# Patient Record
Sex: Female | Born: 1990 | Race: White | Hispanic: No | Marital: Single
Health system: Southern US, Community
[De-identification: ages and names within clinical notes are randomized; demographics above are authoritative.]

---
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
## 2014-05-31 ENCOUNTER — Other Ambulatory Visit: Payer: Self-pay | Admitting: Family Medicine

## 2014-05-31 ENCOUNTER — Other Ambulatory Visit (HOSPITAL_COMMUNITY)
Admission: RE | Admit: 2014-05-31 | Discharge: 2014-05-31 | Disposition: A | Discharge status: Home / Self Care | Payer: BC Managed Care – PPO | Source: Ambulatory Visit | Attending: Family Medicine | Admitting: Family Medicine

## 2014-05-31 DIAGNOSIS — Z124 Encounter for screening for malignant neoplasm of cervix: Secondary | ICD-10-CM | POA: Diagnosis present

## 2014-06-04 LAB — CYTOLOGY - PAP

## 2015-07-09 ENCOUNTER — Other Ambulatory Visit: Payer: Self-pay | Admitting: Family Medicine

## 2015-07-09 ENCOUNTER — Other Ambulatory Visit (HOSPITAL_COMMUNITY)
Admission: RE | Admit: 2015-07-09 | Discharge: 2015-07-09 | Disposition: A | Discharge status: Home / Self Care | Payer: 59 | Source: Ambulatory Visit | Attending: Family Medicine | Admitting: Family Medicine

## 2015-07-09 DIAGNOSIS — Z01411 Encounter for gynecological examination (general) (routine) with abnormal findings: Secondary | ICD-10-CM | POA: Diagnosis present

## 2015-07-14 LAB — CYTOLOGY - PAP

## 2022-06-16 DIAGNOSIS — Z6841 Body Mass Index (BMI) 40.0 and over, adult: Secondary | ICD-10-CM | POA: Diagnosis not present

## 2022-06-16 DIAGNOSIS — Z124 Encounter for screening for malignant neoplasm of cervix: Secondary | ICD-10-CM | POA: Diagnosis not present

## 2022-06-16 DIAGNOSIS — Z0000 Encounter for general adult medical examination without abnormal findings: Secondary | ICD-10-CM | POA: Diagnosis not present

## 2022-06-16 DIAGNOSIS — F4323 Adjustment disorder with mixed anxiety and depressed mood: Secondary | ICD-10-CM | POA: Diagnosis not present

## 2022-06-16 DIAGNOSIS — D509 Iron deficiency anemia, unspecified: Secondary | ICD-10-CM | POA: Diagnosis not present

## 2022-06-23 ENCOUNTER — Telehealth: Payer: Self-pay | Admitting: Physician Assistant

## 2022-06-23 NOTE — Telephone Encounter (Signed)
Scheduled appt per 9/6 referral. Pt is aware of appt date and time. Pt is aware to arrive 15 mins prior to appt time and to bring and updated insurance card. Pt is aware of appt location.   

## 2022-06-24 DIAGNOSIS — R69 Illness, unspecified: Secondary | ICD-10-CM | POA: Diagnosis not present

## 2022-07-07 DIAGNOSIS — R69 Illness, unspecified: Secondary | ICD-10-CM | POA: Diagnosis not present

## 2022-07-12 ENCOUNTER — Telehealth: Payer: Self-pay | Admitting: Physician Assistant

## 2022-07-12 NOTE — Telephone Encounter (Signed)
R/s pt's new hem appt. Pt is aware of new appt date/time.  

## 2022-07-16 ENCOUNTER — Inpatient Hospital Stay: Payer: Self-pay | Admitting: Physician Assistant

## 2022-07-16 ENCOUNTER — Inpatient Hospital Stay: Payer: Self-pay

## 2022-07-16 DIAGNOSIS — R69 Illness, unspecified: Secondary | ICD-10-CM | POA: Diagnosis not present

## 2022-07-21 DIAGNOSIS — Z111 Encounter for screening for respiratory tuberculosis: Secondary | ICD-10-CM | POA: Diagnosis not present

## 2022-07-22 DIAGNOSIS — R69 Illness, unspecified: Secondary | ICD-10-CM | POA: Diagnosis not present

## 2022-07-23 DIAGNOSIS — Z0279 Encounter for issue of other medical certificate: Secondary | ICD-10-CM | POA: Diagnosis not present

## 2022-07-27 NOTE — Progress Notes (Unsigned)
[place] [facility] [phone]   Fax:([phone]  CONSULT NOTE  REFERRING PHYSICIAN: Dr. Daron Offer   REASON FOR CONSULTATION:  Anemia, elevated WBC, and elevated platelet count   HPI Joan Watts is a 31 y.o. female with a past medical history significant for***frequent migraines, obesity, and chronic diarrhea is referred to the clinic for mild anemia, elevated/normal ferritin, elevated white blood cell count, and elevated platelet count.  The patient recently had a follow-up appointment with her PCP for an annual wellness visit on 9/6/thousand 23.  The patient's PCP noted that the patient was struggling with obesity and her weight.  She also endorsed heartburn.  The patient also has chronic intermittent diarrhea but declined referral to gastroenterology.  The patient has a hemorrhoid that waxes and wanes.  The patient previously had celiac testing which was negative.  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  The patient had repeat blood work performed that day which showed persistent microcytic anemia and an MCV low at 73.7.  The patient's platelet count was elevated at 458 K, the patient's white blood cell count was slightly elevated at 13.9.  The patient's iron studies showed iron of 35, ferritin of 257, TIBC at 454, and low saturation at 9%.  The patient's B12 was on the low end of normal at 258.  The patient had persistent lab abnormalities and referral to oncology/hematology was recommended.        Blood thinners, bleeding/bruising/  hemorrhoids? GI?   The oldest records I have dated back to 2020. She had similar appearing labs with elevated WBC of 14.8, mild microcytic anemia of 11.6, MCV of 77.2, and slightly elevated platelet count of 404. I also have labs from 06/16/22 and 08/27/20 which appear similar. To the patient's knowledge>>>   [place] bleeding? Ever need iron? Multivitamin.   Never need blood or iron.   How  taking B12? Compliant?    The patient denies any other known vitamin deficiencies besides B12. She is not presently on an iron supplement. Denies any history of bariatric surgery.  She is not a vegan or vegetarian.  She does not crave ice chips. No hx CKD.    Regardig symptoms of anemia, she reports*** fatigue, decreased exercise tolerance/dyspnea on exertion.  Denies any dizzy spells.  Denies any chest pain.     To the patient's knowledge, she is not sure how long she has had leukocytosis. She denies frequent or recent infection. She reports her allergies are controlled. About 10-15 years ago, she used to have severe allergies that often required steroid injections as well as p.o. steroids. She denies recent or frequent steroid use except in June 2023 for tendinitis in her ankle.    The patient is feeling fairly well today without any concerning complaints.  She denies any recent fevers, chills, unexplained weight loss, or night sweats.  Denies any recent nausea, vomiting, diarrhea, constipation, skin infections, dysuria, or rashes. Denies any recent sick contacts. For contraception, she does have Mirena IUD. She denies any inflammatory health conditions.  She denies any abdominal pain, early satiety, or abdominal bloating.  She denies any known lymphadenopathy. F?C/Ns/ LN/ Wt   HPI  No past medical history on file.  *** The histories are not reviewed yet. Please review them in the "History" navigator section and refresh this [place].  No family history on file.  Social History    Not on File  No current outpatient medications on file.   No  current facility-administered medications for this visit.    REVIEW OF SYSTEMS:   Review of Systems  Constitutional: Negative for appetite change, chills, fatigue, fever and unexpected weight change.  HENT:   Negative for mouth sores, nosebleeds, sore throat and trouble swallowing.   Eyes: Negative for eye problems and icterus.  Respiratory:  Negative for cough, hemoptysis, shortness of breath and wheezing.   Cardiovascular: Negative for chest pain and leg swelling.  Gastrointestinal: Negative for abdominal pain, constipation, diarrhea, nausea and vomiting.  Genitourinary: Negative for bladder incontinence, difficulty urinating, dysuria, frequency and hematuria.   Musculoskeletal: Negative for back pain, gait problem, neck pain and neck stiffness.  Skin: Negative for itching and rash.  Neurological: Negative for dizziness, extremity weakness, gait problem, headaches, light-headedness and seizures.  Hematological: Negative for adenopathy. Does not bruise/bleed easily.  Psychiatric/Behavioral: Negative for confusion, depression and sleep disturbance. The patient is not nervous/anxious.     PHYSICAL EXAMINATION:  There were no vitals taken for this visit.  ECOG PERFORMANCE STATUS: {CHL ONC ECOG [id number]  Physical Exam  Constitutional: Oriented to person, place, and time and well-developed, well-nourished, and in no distress. No distress.  HENT:  Head: Normocephalic and atraumatic.  Mouth/Throat: Oropharynx is clear and moist. No oropharyngeal exudate.  Eyes: Conjunctivae are normal. Right eye exhibits no discharge. Left eye exhibits no discharge. No scleral icterus.  Neck: Normal range of motion. Neck supple.  Cardiovascular: Normal rate, regular rhythm, normal heart sounds and intact distal pulses.   Pulmonary/Chest: Effort normal and breath sounds normal. No respiratory distress. No wheezes. No rales.  Abdominal: Soft. Bowel sounds are normal. Exhibits no distension and no mass. There is no tenderness.  Musculoskeletal: Normal range of motion. Exhibits no edema.  Lymphadenopathy:    No cervical adenopathy.  Neurological: Alert and oriented to person, place, and time. Exhibits normal muscle tone. Gait normal. Coordination normal.  Skin: Skin is warm and dry. No rash noted. Not diaphoretic. No erythema. No pallor.   Psychiatric: Mood, memory and judgment normal.  Vitals reviewed.  LABORATORY DATA: No results found for: "WBC", "HGB", "HCT", "MCV", "PLT"    Chemistry   No results found for: "NA", "K", "CL", "CO2", "BUN", "CREATININE", "GLU" No results found for: "CALCIUM", "ALKPHOS", "AST", "ALT", "BILITOT"     RADIOGRAPHIC STUDIES: No results found.  ASSESSMENT: This is a very pleasant 31 year old female referred to the clinic for anemia, elevated platelet, and elevated white blood cell count.  The patient was seen by Dr. Julien Nordmann today.  The patient several lab studies performed including a CBC, CMP, LDH, iron studies, ferritin, B12, folate, heavy metal testing, copper, hemoglobin electrophoresis, and JAK2 versus BCR ABL?  ESR and CRP? Stool cards???  The patient voices understanding of current disease status and treatment options and is in agreement with the current care plan.  All questions were answered. The patient knows to call the clinic with any problems, questions or concerns. We can certainly see the patient much sooner if necessary.  Thank you so much for allowing me to participate in the care of [facility]. I will continue to follow up the patient with you and assist in her care.  I spent {CHL ONC TIME VISIT - IBBCW:[id number]} counseling the patient face to face. The total time spent in the appointment was {CHL ONC TIME VISIT - [place]:[id number]}.  Disclaimer: This note was dictated with voice recognition software. Similar sounding words can inadvertently be transcribed and may not be corrected upon review.   [place]  July 27, 2022, 1:54 PM

## 2022-07-28 ENCOUNTER — Other Ambulatory Visit: Payer: Self-pay | Admitting: *Deleted

## 2022-07-28 ENCOUNTER — Inpatient Hospital Stay: Payer: 59

## 2022-07-28 ENCOUNTER — Other Ambulatory Visit: Payer: Self-pay | Admitting: Physician Assistant

## 2022-07-28 ENCOUNTER — Other Ambulatory Visit: Payer: Self-pay

## 2022-07-28 ENCOUNTER — Inpatient Hospital Stay: Payer: 59 | Attending: Physician Assistant | Admitting: Physician Assistant

## 2022-07-28 VITALS — BP 144/90 | HR 89 | Temp 99.0°F | Resp 16 | Wt 254.5 lb

## 2022-07-28 DIAGNOSIS — E538 Deficiency of other specified B group vitamins: Secondary | ICD-10-CM

## 2022-07-28 DIAGNOSIS — D75839 Thrombocytosis, unspecified: Secondary | ICD-10-CM | POA: Diagnosis not present

## 2022-07-28 DIAGNOSIS — D72829 Elevated white blood cell count, unspecified: Secondary | ICD-10-CM

## 2022-07-28 DIAGNOSIS — D509 Iron deficiency anemia, unspecified: Secondary | ICD-10-CM | POA: Insufficient documentation

## 2022-07-28 DIAGNOSIS — D649 Anemia, unspecified: Secondary | ICD-10-CM

## 2022-07-28 DIAGNOSIS — Z800 Family history of malignant neoplasm of digestive organs: Secondary | ICD-10-CM

## 2022-07-28 LAB — CMP (CANCER CENTER ONLY)
ALT: 33 U/L (ref 0–44)
AST: 20 U/L (ref 15–41)
Albumin: 3.7 g/dL (ref 3.5–5.0)
Alkaline Phosphatase: 72 U/L (ref 38–126)
Anion gap: 7 (ref 5–15)
BUN: 9 mg/dL (ref 6–20)
CO2: 24 mmol/L (ref 22–32)
Calcium: 8.8 mg/dL — ABNORMAL LOW (ref 8.9–10.3)
Chloride: 106 mmol/L (ref 98–111)
Creatinine: 0.57 mg/dL (ref 0.44–1.00)
GFR, Estimated: >60 mL/min (ref >60)
Glucose, Bld: 96 mg/dL (ref 70–99)
Potassium: 4.1 mmol/L (ref 3.5–5.1)
Sodium: 137 mmol/L (ref 135–145)
Total Bilirubin: 0.4 mg/dL (ref 0.3–1.2)
Total Protein: 7.6 g/dL (ref 6.5–8.1)

## 2022-07-28 LAB — CBC WITH DIFFERENTIAL (CANCER CENTER ONLY)
Abs Immature Granulocytes: 0.04 10*3/uL (ref 0.00–0.07)
Basophils Absolute: 0 10*3/uL (ref 0.0–0.1)
Basophils Relative: 0 %
Eosinophils Absolute: 0.1 10*3/uL (ref 0.0–0.5)
Eosinophils Relative: 1 %
HCT: 35.3 % — ABNORMAL LOW (ref 36.0–46.0)
Hemoglobin: 12.5 g/dL (ref 12.0–15.0)
Immature Granulocytes: 0 %
Lymphocytes Relative: 24 %
Lymphs Abs: 2.6 10*3/uL (ref 0.7–4.0)
MCH: 25.5 pg — ABNORMAL LOW (ref 26.0–34.0)
MCHC: 35.4 g/dL (ref 30.0–36.0)
MCV: 71.9 fL — ABNORMAL LOW (ref 80.0–100.0)
Monocytes Absolute: 0.6 10*3/uL (ref 0.1–1.0)
Monocytes Relative: 5 %
Neutro Abs: 7.7 10*3/uL (ref 1.7–7.7)
Neutrophils Relative %: 70 %
Platelet Count: 430 10*3/uL — ABNORMAL HIGH (ref 150–400)
RBC: 4.91 MIL/uL (ref 3.87–5.11)
RDW: 15.1 % (ref 11.5–15.5)
WBC Count: 11.1 10*3/uL — ABNORMAL HIGH (ref 4.0–10.5)
nRBC: 0 % (ref 0.0–0.2)

## 2022-07-28 LAB — VITAMIN B12: Vitamin B-12: 189 pg/mL (ref 180–914)

## 2022-07-28 LAB — C-REACTIVE PROTEIN: CRP: 4.7 mg/dL — ABNORMAL HIGH (ref <1.0)

## 2022-07-28 LAB — SEDIMENTATION RATE: Sed Rate: 45 mm/hr — ABNORMAL HIGH (ref 0–22)

## 2022-07-28 LAB — IRON AND IRON BINDING CAPACITY (CC-WL,HP ONLY)
Iron: 37 ug/dL (ref 28–170)
Saturation Ratios: 10 % — ABNORMAL LOW (ref 10.4–31.8)
TIBC: 389 ug/dL (ref 250–450)
UIBC: 352 ug/dL

## 2022-07-28 LAB — FERRITIN: Ferritin: 254 ng/mL (ref 11–307)

## 2022-07-28 LAB — LACTATE DEHYDROGENASE: LDH: 150 U/L (ref 98–192)

## 2022-07-28 LAB — FOLATE: Folate: 6.8 ng/mL (ref >5.9)

## 2022-07-29 ENCOUNTER — Telehealth: Payer: Self-pay | Admitting: Pharmacy Technician

## 2022-07-29 ENCOUNTER — Encounter: Payer: Self-pay | Admitting: Physician Assistant

## 2022-07-29 DIAGNOSIS — R69 Illness, unspecified: Secondary | ICD-10-CM | POA: Diagnosis not present

## 2022-07-29 LAB — ANTINUCLEAR ANTIBODIES, IFA: ANA Ab, IFA: NEGATIVE

## 2022-07-29 LAB — RHEUMATOID FACTOR: Rheumatoid fact SerPl-aCnc: <10 IU/mL (ref <14.0)

## 2022-07-29 NOTE — Telephone Encounter (Signed)
Dr. Toya Smothers, Juluis Rainier note:  Auth Submission: NO AUTH NEEDED Payer: Baldemar Lenis Medication & CPT/J Code(s) submitted: Venofer (Iron Sucrose) J1756 Route of submission (phone, fax, portal):  Phone # [phone] Fax # Auth type: Buy/Bill Units/visits requested: x3 Reference number: [id number] Approval from: 08/17/22 to 10/17/22   Patient will be scheduled as soon as possible

## 2022-07-30 LAB — HGB FRACTIONATION CASCADE
Hgb A2: 2.4 % (ref 1.8–3.2)
Hgb A: 97.6 % (ref 96.4–98.8)
Hgb F: 0 % (ref 0.0–2.0)
Hgb S: 0 %

## 2022-08-05 DIAGNOSIS — R69 Illness, unspecified: Secondary | ICD-10-CM | POA: Diagnosis not present

## 2022-08-10 ENCOUNTER — Ambulatory Visit (INDEPENDENT_AMBULATORY_CARE_PROVIDER_SITE_OTHER): Payer: 59

## 2022-08-10 VITALS — BP 100/67 | HR 57 | Temp 98.2°F | Resp 18 | Ht 60.0 in | Wt 255.8 lb

## 2022-08-10 DIAGNOSIS — D509 Iron deficiency anemia, unspecified: Secondary | ICD-10-CM | POA: Diagnosis not present

## 2022-08-10 MED ORDER — SODIUM CHLORIDE 0.9 % IV SOLN
300.0000 mg | Freq: Once | INTRAVENOUS | Status: AC
  Administered 2022-08-10: 300 mg via INTRAVENOUS
  Filled 2022-08-10: qty 15

## 2022-08-10 MED ORDER — DIPHENHYDRAMINE HCL 25 MG PO CAPS
25.0000 mg | ORAL_CAPSULE | Freq: Once | ORAL | Status: AC
  Administered 2022-08-10: 25 mg via ORAL
  Filled 2022-08-10: qty 1

## 2022-08-10 MED ORDER — ACETAMINOPHEN 325 MG PO TABS
650.0000 mg | ORAL_TABLET | Freq: Once | ORAL | Status: AC
  Administered 2022-08-10: 650 mg via ORAL
  Filled 2022-08-10: qty 2

## 2022-08-10 NOTE — Progress Notes (Signed)
Diagnosis: Iron Deficiency Anemia  Provider:  Marshell Garfinkel MD  Procedure: Infusion  IV Type: Peripheral, IV Location: R Antecubital  Venofer (Iron Sucrose), Dose: 300 mg  Infusion Start Time: 9604  Infusion Stop Time: 5409  Post Infusion IV Care: Observation period completed and Peripheral IV Discontinued  Discharge: Condition: Good, Destination: Home . AVS provided to patient.   Performed by:  Koren Shiver, RN

## 2022-08-17 ENCOUNTER — Ambulatory Visit: Payer: 59

## 2022-08-18 ENCOUNTER — Ambulatory Visit (INDEPENDENT_AMBULATORY_CARE_PROVIDER_SITE_OTHER): Payer: 59

## 2022-08-18 VITALS — BP 117/78 | HR 66 | Temp 97.8°F | Resp 16 | Ht 60.0 in | Wt 257.4 lb

## 2022-08-18 DIAGNOSIS — D509 Iron deficiency anemia, unspecified: Secondary | ICD-10-CM

## 2022-08-18 MED ORDER — DIPHENHYDRAMINE HCL 25 MG PO CAPS
25.0000 mg | ORAL_CAPSULE | Freq: Once | ORAL | Status: AC
  Administered 2022-08-18: 25 mg via ORAL
  Filled 2022-08-18: qty 1

## 2022-08-18 MED ORDER — SODIUM CHLORIDE 0.9 % IV SOLN
300.0000 mg | Freq: Once | INTRAVENOUS | Status: AC
  Administered 2022-08-18: 300 mg via INTRAVENOUS
  Filled 2022-08-18: qty 15

## 2022-08-18 MED ORDER — ACETAMINOPHEN 325 MG PO TABS
650.0000 mg | ORAL_TABLET | Freq: Once | ORAL | Status: AC
  Administered 2022-08-18: 650 mg via ORAL
  Filled 2022-08-18: qty 2

## 2022-08-18 NOTE — Progress Notes (Signed)
Diagnosis: Iron Deficiency Anemia  Provider:  Marshell Garfinkel MD  Procedure: Infusion  IV Type: Peripheral, IV Location: R Antecubital  Venofer (Iron Sucrose), Dose: 300 mg  Infusion Start Time: 1003  Infusion Stop Time: 1150  Post Infusion IV Care: Peripheral IV Discontinued  Discharge: Condition: Good, Destination: Home . AVS provided to patient.   Performed by:  Arnoldo Morale, RN

## 2022-08-19 DIAGNOSIS — R69 Illness, unspecified: Secondary | ICD-10-CM | POA: Diagnosis not present

## 2022-08-24 ENCOUNTER — Ambulatory Visit (INDEPENDENT_AMBULATORY_CARE_PROVIDER_SITE_OTHER): Payer: 59

## 2022-08-24 VITALS — BP 106/66 | HR 76 | Temp 98.7°F | Resp 18 | Ht 60.0 in | Wt 257.0 lb

## 2022-08-24 DIAGNOSIS — D509 Iron deficiency anemia, unspecified: Secondary | ICD-10-CM | POA: Diagnosis not present

## 2022-08-24 MED ORDER — DIPHENHYDRAMINE HCL 25 MG PO CAPS
25.0000 mg | ORAL_CAPSULE | Freq: Once | ORAL | Status: AC
  Administered 2022-08-24: 25 mg via ORAL
  Filled 2022-08-24: qty 1

## 2022-08-24 MED ORDER — SODIUM CHLORIDE 0.9 % IV SOLN
300.0000 mg | Freq: Once | INTRAVENOUS | Status: AC
  Administered 2022-08-24: 300 mg via INTRAVENOUS
  Filled 2022-08-24: qty 15

## 2022-08-24 MED ORDER — ACETAMINOPHEN 325 MG PO TABS
650.0000 mg | ORAL_TABLET | Freq: Once | ORAL | Status: AC
  Administered 2022-08-24: 650 mg via ORAL
  Filled 2022-08-24: qty 2

## 2022-08-24 NOTE — Progress Notes (Signed)
Diagnosis: Iron Deficiency Anemia  Provider:  Marshell Garfinkel MD  Procedure: Infusion  IV Type: Peripheral, IV Location: L Antecubital  Venofer (Iron Sucrose), Dose: 300 mg  Infusion Start Time: 1009  Infusion Stop Time: 1211  Post Infusion IV Care: Peripheral IV Discontinued  Discharge: Condition: Good, Destination: Home . AVS provided to patient.   Performed by:  Koren Shiver, RN

## 2022-08-26 DIAGNOSIS — R69 Illness, unspecified: Secondary | ICD-10-CM | POA: Diagnosis not present

## 2022-09-02 DIAGNOSIS — R69 Illness, unspecified: Secondary | ICD-10-CM | POA: Diagnosis not present

## 2022-09-04 ENCOUNTER — Telehealth: Payer: Self-pay | Admitting: Internal Medicine

## 2022-09-04 NOTE — Telephone Encounter (Signed)
Called patient regarding upcoming December appointments, left a voicemail. 

## 2022-09-16 DIAGNOSIS — R69 Illness, unspecified: Secondary | ICD-10-CM | POA: Diagnosis not present

## 2022-09-28 ENCOUNTER — Ambulatory Visit: Payer: 59 | Admitting: Internal Medicine

## 2022-09-28 ENCOUNTER — Encounter: Payer: 59 | Admitting: Genetic Counselor

## 2022-09-28 ENCOUNTER — Other Ambulatory Visit: Payer: 59

## 2022-10-04 ENCOUNTER — Other Ambulatory Visit: Payer: Self-pay

## 2022-10-04 ENCOUNTER — Inpatient Hospital Stay: Payer: 59

## 2022-10-04 ENCOUNTER — Inpatient Hospital Stay: Payer: 59 | Admitting: Internal Medicine

## 2022-10-04 ENCOUNTER — Inpatient Hospital Stay: Payer: 59 | Attending: Physician Assistant

## 2022-10-04 VITALS — BP 109/85 | HR 96 | Temp 97.3°F | Resp 17 | Wt 260.0 lb

## 2022-10-04 DIAGNOSIS — D72829 Elevated white blood cell count, unspecified: Secondary | ICD-10-CM | POA: Insufficient documentation

## 2022-10-04 DIAGNOSIS — D539 Nutritional anemia, unspecified: Secondary | ICD-10-CM | POA: Diagnosis not present

## 2022-10-04 DIAGNOSIS — Z79899 Other long term (current) drug therapy: Secondary | ICD-10-CM | POA: Insufficient documentation

## 2022-10-04 DIAGNOSIS — D509 Iron deficiency anemia, unspecified: Secondary | ICD-10-CM | POA: Insufficient documentation

## 2022-10-04 DIAGNOSIS — D75839 Thrombocytosis, unspecified: Secondary | ICD-10-CM | POA: Diagnosis present

## 2022-10-04 DIAGNOSIS — E538 Deficiency of other specified B group vitamins: Secondary | ICD-10-CM

## 2022-10-04 LAB — FERRITIN: Ferritin: 674 ng/mL — ABNORMAL HIGH (ref 11–307)

## 2022-10-04 LAB — CBC WITH DIFFERENTIAL (CANCER CENTER ONLY)
Abs Immature Granulocytes: 0.07 10*3/uL (ref 0.00–0.07)
Basophils Absolute: 0.1 10*3/uL (ref 0.0–0.1)
Basophils Relative: 0 %
Eosinophils Absolute: 0.2 10*3/uL (ref 0.0–0.5)
Eosinophils Relative: 1 %
HCT: 34.7 % — ABNORMAL LOW (ref 36.0–46.0)
Hemoglobin: 12.4 g/dL (ref 12.0–15.0)
Immature Granulocytes: 0 %
Lymphocytes Relative: 20 %
Lymphs Abs: 3.2 10*3/uL (ref 0.7–4.0)
MCH: 26.6 pg (ref 26.0–34.0)
MCHC: 35.7 g/dL (ref 30.0–36.0)
MCV: 74.3 fL — ABNORMAL LOW (ref 80.0–100.0)
Monocytes Absolute: 0.4 10*3/uL (ref 0.1–1.0)
Monocytes Relative: 3 %
Neutro Abs: 12.2 10*3/uL — ABNORMAL HIGH (ref 1.7–7.7)
Neutrophils Relative %: 76 %
Platelet Count: 433 10*3/uL — ABNORMAL HIGH (ref 150–400)
RBC: 4.67 MIL/uL (ref 3.87–5.11)
RDW: 15.4 % (ref 11.5–15.5)
WBC Count: 16.2 10*3/uL — ABNORMAL HIGH (ref 4.0–10.5)
nRBC: 0 % (ref 0.0–0.2)

## 2022-10-04 LAB — IRON AND IRON BINDING CAPACITY (CC-WL,HP ONLY)
Iron: 41 ug/dL (ref 28–170)
Saturation Ratios: 13 % (ref 10.4–31.8)
TIBC: 322 ug/dL (ref 250–450)
UIBC: 281 ug/dL (ref 148–442)

## 2022-10-04 LAB — VITAMIN B12: Vitamin B-12: 291 pg/mL (ref 180–914)

## 2022-10-04 NOTE — Progress Notes (Signed)
[facility] Telephone:([phone]   Fax:(336) [phone]  OFFICE PROGRESS NOTE  Gwenlyn Found, MD [address] [place] [address]  DIAGNOSIS: 1) leukocytosis and thrombocytosis suspicious for myeloproliferative disorder versus reactive in nature. 2) microcytic anemia secondary to iron deficiency.  PRIOR THERAPY: Status post iron infusion with Venofer 300 Mg IV weekly for 3 weeks.  CURRENT THERAPY:  INTERVAL HISTORY: Joan Watts 31 y.o. female returns to the clinic today for follow-up visit.  The patient is feeling fine today with no concerning complaints except for the recurrent infection as she works as a Manufacturing systems engineer and she has been out of work for the last 2 months because of the recurrent infection and chest congestion.  She denied having any current chest pain, shortness of breath, cough or hemoptysis.  She has no nausea, vomiting, diarrhea or constipation.  She has no headache or visual changes.  She tolerated her previous iron infusion fairly well.  She is here today for evaluation and repeat blood work.  MEDICAL HISTORY:No past medical history on file.  ALLERGIES:  has no allergies on file.  MEDICATIONS:  Current Outpatient Medications  Medication Sig Dispense Refill   ALAYCEN 1/35 tablet Take 1 tablet by mouth daily.     cetirizine (ZYRTEC) 10 MG tablet Take 1 tablet by mouth daily.     escitalopram (LEXAPRO) 10 MG tablet Take by mouth.     omeprazole (PRILOSEC) 20 MG capsule Take by mouth.     No current facility-administered medications for this visit.    SURGICAL HISTORY:   REVIEW OF SYSTEMS:  A comprehensive review of systems was negative except for: Constitutional: positive for fatigue Ears, nose, mouth, throat, and face: positive for nasal congestion   PHYSICAL EXAMINATION: General appearance: alert, cooperative, fatigued, and no distress Head: Normocephalic, without obvious abnormality, atraumatic Neck: no  adenopathy, no JVD, supple, symmetrical, trachea midline, and thyroid not enlarged, symmetric, no tenderness/mass/nodules Lymph nodes: Cervical, supraclavicular, and axillary nodes normal. Resp: clear to auscultation bilaterally Back: symmetric, no curvature. ROM normal. No CVA tenderness. Cardio: regular rate and rhythm, S1, S2 normal, no murmur, click, rub or gallop GI: soft, non-tender; bowel sounds normal; no masses,  no organomegaly Extremities: extremities normal, atraumatic, no cyanosis or edema  ECOG PERFORMANCE STATUS: 1 - Symptomatic but completely ambulatory  Blood pressure 109/85, pulse 96, temperature (!) 97.3 F (36.3 C), temperature source Temporal, resp. rate 17, weight 260 lb (117.9 kg), SpO2 99 %.  LABORATORY DATA: Lab Results  Component Value Date   WBC 16.2 (H) 10/04/2022   HGB 12.4 10/04/2022   HCT 34.7 (L) 10/04/2022   MCV 74.3 (L) 10/04/2022   PLT 433 (H) 10/04/2022      Chemistry      Component Value Date/Time   NA 137 07/28/2022 1339   K 4.1 07/28/2022 1339   CL 106 07/28/2022 1339   CO2 24 07/28/2022 1339   BUN 9 07/28/2022 1339   CREATININE 0.57 07/28/2022 1339      Component Value Date/Time   CALCIUM 8.8 (L) 07/28/2022 1339   ALKPHOS 72 07/28/2022 1339   AST 20 07/28/2022 1339   ALT 33 07/28/2022 1339   BILITOT 0.4 07/28/2022 1339       RADIOGRAPHIC STUDIES: No results found.  ASSESSMENT AND PLAN: This is a very pleasant 31 years old with persistent leukocytosis and thrombocytosis likely reactive in nature but underlying myeloproliferative disorder could not be ruled out at this point.  The patient also has microcytosis questionable for iron deficiency and treated with iron infusion with some improvement.  She had hemoglobin electrophoresis that was reported to be normal. Repeat CBC today showed persistent elevation of her total white blood count as well as platelet count but no evidence for anemia. I recommended for the patient to proceed  with [place] study for BCR/ABL as well as JAK2 mutation panel to rule out any underlying myeloproliferative disorder. I will see her back for follow-up visit in 2 months for evaluation with repeat blood work but I will call her sooner if there is any concerning abnormalities on the pending blood work. The patient was advised to call immediately if she has any other concerning symptoms in the interval. The patient voices understanding of current disease status and treatment options and is in agreement with the current care plan.  All questions were answered. The patient knows to call the clinic with any problems, questions or concerns. We can certainly see the patient much sooner if necessary.  The total time spent in the appointment was 20 minutes.  Disclaimer: This note was dictated with voice recognition software. Similar sounding words can inadvertently be transcribed and may not be corrected upon review.

## 2022-10-13 LAB — JAK2 (INCLUDING V617F AND EXON 12), MPL,& CALR W/RFL MPN PANEL (NGS)

## 2022-10-13 LAB — BCR ABL1 FISH (GENPATH)

## 2022-11-19 ENCOUNTER — Telehealth: Payer: Self-pay | Admitting: Internal Medicine

## 2022-11-19 NOTE — Telephone Encounter (Signed)
Patient called to cancel February appointments, patient will call back when ready to reschedule.

## 2022-12-06 ENCOUNTER — Ambulatory Visit: Payer: 59 | Admitting: Internal Medicine

## 2022-12-06 ENCOUNTER — Other Ambulatory Visit: Payer: 59
